# Patient Record
Sex: Male | Born: 1950
Health system: Southern US, Community
[De-identification: ages and names within clinical notes are randomized; demographics above are authoritative.]

---

## 2001-11-03 ENCOUNTER — Ambulatory Visit (HOSPITAL_BASED_OUTPATIENT_CLINIC_OR_DEPARTMENT_OTHER): Admission: RE | Admit: 2001-11-03 | Discharge: 2001-11-03 | Payer: Self-pay | Admitting: Orthopaedic Surgery

## 2001-11-20 ENCOUNTER — Emergency Department (HOSPITAL_COMMUNITY): Admission: EM | Admit: 2001-11-20 | Discharge: 2001-11-20 | Payer: Self-pay | Admitting: Emergency Medicine

## 2001-11-20 ENCOUNTER — Encounter: Payer: Self-pay | Admitting: Emergency Medicine

## 2002-06-02 ENCOUNTER — Ambulatory Visit (HOSPITAL_BASED_OUTPATIENT_CLINIC_OR_DEPARTMENT_OTHER): Admission: RE | Admit: 2002-06-02 | Discharge: 2002-06-02 | Payer: Self-pay | Admitting: Urology

## 2004-01-09 ENCOUNTER — Ambulatory Visit (HOSPITAL_COMMUNITY): Admission: RE | Admit: 2004-01-09 | Discharge: 2004-01-09 | Payer: Self-pay | Admitting: Neurosurgery

## 2017-04-09 ENCOUNTER — Other Ambulatory Visit: Payer: Self-pay | Admitting: Acute Care

## 2017-04-09 DIAGNOSIS — Z122 Encounter for screening for malignant neoplasm of respiratory organs: Secondary | ICD-10-CM

## 2017-04-09 DIAGNOSIS — F17210 Nicotine dependence, cigarettes, uncomplicated: Secondary | ICD-10-CM

## 2017-04-30 ENCOUNTER — Ambulatory Visit: Payer: Self-pay

## 2017-04-30 ENCOUNTER — Encounter: Payer: Self-pay | Admitting: Acute Care

## 2018-07-02 ENCOUNTER — Other Ambulatory Visit: Payer: Self-pay | Admitting: Acute Care

## 2018-07-02 DIAGNOSIS — Z122 Encounter for screening for malignant neoplasm of respiratory organs: Secondary | ICD-10-CM

## 2018-07-02 DIAGNOSIS — F17210 Nicotine dependence, cigarettes, uncomplicated: Secondary | ICD-10-CM

## 2018-08-03 ENCOUNTER — Ambulatory Visit: Payer: Self-pay

## 2018-08-03 ENCOUNTER — Encounter: Payer: Self-pay | Admitting: Acute Care

## 2018-11-18 ENCOUNTER — Ambulatory Visit (INDEPENDENT_AMBULATORY_CARE_PROVIDER_SITE_OTHER): Payer: Self-pay | Admitting: Acute Care

## 2018-11-18 ENCOUNTER — Ambulatory Visit (INDEPENDENT_AMBULATORY_CARE_PROVIDER_SITE_OTHER)
Admission: RE | Admit: 2018-11-18 | Discharge: 2018-11-18 | Disposition: A | Discharge status: Home / Self Care | Payer: Medicare HMO | Source: Ambulatory Visit | Attending: Acute Care | Admitting: Acute Care

## 2018-11-18 ENCOUNTER — Encounter: Payer: Self-pay | Admitting: Acute Care

## 2018-11-18 ENCOUNTER — Other Ambulatory Visit: Payer: Self-pay

## 2018-11-18 VITALS — BP 110/72 | HR 82 | Temp 98.0°F | Ht 68.0 in | Wt 193.4 lb

## 2018-11-18 DIAGNOSIS — Z122 Encounter for screening for malignant neoplasm of respiratory organs: Secondary | ICD-10-CM

## 2018-11-18 DIAGNOSIS — R69 Illness, unspecified: Secondary | ICD-10-CM | POA: Diagnosis not present

## 2018-11-18 DIAGNOSIS — F17210 Nicotine dependence, cigarettes, uncomplicated: Secondary | ICD-10-CM

## 2018-11-18 NOTE — Progress Notes (Signed)
Shared Decision Making Visit Lung Cancer Screening Program [phone])   Eligibility:  Age 68 y.o.  Pack Years Smoking History Calculation 40 pack year smoking history (# packs/per year x # years smoked)  Recent History of coughing up blood  no  Unexplained weight loss? no ( >Than 15 pounds within the last 6 months )  Prior History Lung / other cancer no (Diagnosis within the last 5 years already requiring surveillance chest CT Scans).  Smoking Status Current Smoker  Former Smokers: Years since quit: NA  Quit Date: NA  Visit Components:  Discussion included one or more decision making aids. yes  Discussion included risk/benefits of screening. yes  Discussion included potential follow up diagnostic testing for abnormal scans. yes  Discussion included meaning and risk of over diagnosis. yes  Discussion included meaning and risk of False Positives. yes  Discussion included meaning of total radiation exposure. yes  Counseling Included:  Importance of adherence to annual lung cancer LDCT screening. yes  Impact of comorbidities on ability to participate in the program. yes  Ability and willingness to under diagnostic treatment. yes  Smoking Cessation Counseling:  Current Smokers:   Discussed importance of smoking cessation. yes  Information about tobacco cessation classes and interventions provided to patient. yes  Patient provided with "ticket" for LDCT Scan. yes  Symptomatic Patient. no  CounselingNA  Diagnosis Code: Tobacco Use Z72.0  Asymptomatic Patient yes  Counseling (Intermediate counseling: > three minutes counseling) Y0737  Former Smokers:   Discussed the importance of maintaining cigarette abstinence. yes  Diagnosis Code: Personal History of Nicotine Dependence. T06.269  Information about tobacco cessation classes and interventions provided to patient. Yes  Patient provided with "ticket" for LDCT Scan. yes  Written Order for Lung Cancer  Screening with LDCT placed in Epic. Yes (CT Chest Lung Cancer Screening Low Dose W/O CM) [id number] Z12.2-Screening of respiratory organs Z87.891-Personal history of nicotine dependence  Temp 98 F (36.7 C) (Oral)   Ht 5\' 8"  (1.727 m)   Wt 193 lb 6.4 oz (87.7 kg)   BMI 29.41 kg/m  BP 110/72   Pulse 82   Temp 98 F (36.7 C) (Oral)   Ht 5\' 8"  (1.727 m)   Wt 193 lb 6.4 oz (87.7 kg)   SpO2 96%   BMI 29.41 kg/m    I have spent 25 minutes of face to face time with Ms. Ghosh discussing the risks and benefits of lung cancer screening. We viewed a power point together that explained in detail the above noted topics. We paused at intervals to allow for questions to be asked and answered to ensure understanding.We discussed that the single most powerful action that she can take to decrease her risk of developing lung cancer is to quit smoking. We discussed whether or not she is ready to commit to setting a quit date. We discussed options for tools to aid in quitting smoking including nicotine replacement therapy, non-nicotine medications, support groups, Quit Smart classes, and behavior modification. We discussed that often times setting smaller, more achievable goals, such as eliminating 1 cigarette a day for a week and then 2 cigarettes a day for a week can be helpful in slowly decreasing the number of cigarettes smoked. This allows for a sense of accomplishment as well as providing a clinical benefit. I gave her the " Be Stronger Than Your Excuses" card with contact information for community resources, classes, free nicotine replacement therapy, and access to mobile apps, text messaging, and on-line smoking cessation help.  I have also given her my card and contact information in the event she needs to contact me. We discussed the time and location of the scan, and that either Abigail Miyamotoenise Phelps RN or I will call with the results within 24-48 hours of receiving them. I have offered her  a copy of the power  point we viewed  as a resource in the event they need reinforcement of the concepts we discussed today in the office. The patient verbalized understanding of all of  the above and had no further questions upon leaving the office. They have my contact information in the event they have any further questions.  I spent 4 minutes counseling on smoking cessation and the health risks of continued tobacco abuse.  I explained to the patient that there has been a high incidence of coronary artery disease noted on these exams. I explained that this is a non-gated exam therefore degree or severity cannot be determined. This patient is on statin therapy. I have asked the patient to follow-up with their PCP regarding any incidental finding of coronary artery disease and management with diet or medication as their PCP  feels is clinically indicated. The patient verbalized understanding of the above and had no further questions upon completion of the visit.      Bevelyn NgoSarah F , NP 11/18/2018 9:41 AM

## 2018-11-23 ENCOUNTER — Other Ambulatory Visit: Payer: Self-pay | Admitting: *Deleted

## 2018-11-23 DIAGNOSIS — Z87891 Personal history of nicotine dependence: Secondary | ICD-10-CM

## 2018-11-23 DIAGNOSIS — Z122 Encounter for screening for malignant neoplasm of respiratory organs: Secondary | ICD-10-CM

## 2018-11-23 DIAGNOSIS — F17210 Nicotine dependence, cigarettes, uncomplicated: Secondary | ICD-10-CM

## 2018-12-01 DIAGNOSIS — H04123 Dry eye syndrome of bilateral lacrimal glands: Secondary | ICD-10-CM | POA: Diagnosis not present

## 2018-12-01 DIAGNOSIS — H2513 Age-related nuclear cataract, bilateral: Secondary | ICD-10-CM | POA: Diagnosis not present

## 2018-12-01 DIAGNOSIS — H5203 Hypermetropia, bilateral: Secondary | ICD-10-CM | POA: Diagnosis not present

## 2018-12-01 DIAGNOSIS — H52223 Regular astigmatism, bilateral: Secondary | ICD-10-CM | POA: Diagnosis not present

## 2018-12-01 DIAGNOSIS — H524 Presbyopia: Secondary | ICD-10-CM | POA: Diagnosis not present

## 2019-03-20 DIAGNOSIS — Z03818 Encounter for observation for suspected exposure to other biological agents ruled out: Secondary | ICD-10-CM | POA: Diagnosis not present

## 2019-06-05 ENCOUNTER — Ambulatory Visit: Payer: Medicare HMO | Attending: Internal Medicine

## 2019-06-05 DIAGNOSIS — Z23 Encounter for immunization: Secondary | ICD-10-CM | POA: Insufficient documentation

## 2019-06-05 NOTE — Progress Notes (Signed)
   Covid-19 Vaccination Clinic  Name:  Nicholas Cisneros    MRN: [id number] DOB: Sep 27, 1950  06/05/2019  Mr. Osei was observed post Covid-19 immunization for 15 minutes without incidence. He was provided with Vaccine Information Sheet and instruction to access the V-Safe system.   Mr. Reiswig was instructed to call 911 with any severe reactions post vaccine: Marland Kitchen Difficulty breathing  . Swelling of your face and throat  . A fast heartbeat  . A bad rash all over your body  . Dizziness and weakness    Immunizations Administered    Name Date Dose VIS Date Route   Pfizer COVID-19 Vaccine 06/05/2019  3:17 PM 0.3 mL 04/23/2019 Intramuscular   Manufacturer: [employer], [employer]   Lot: [id number]   NDC: [address]-2

## 2019-06-12 DIAGNOSIS — Z20822 Contact with and (suspected) exposure to covid-19: Secondary | ICD-10-CM | POA: Diagnosis not present

## 2019-06-21 ENCOUNTER — Other Ambulatory Visit: Payer: Self-pay

## 2019-06-21 ENCOUNTER — Emergency Department (HOSPITAL_COMMUNITY): Payer: Medicare HMO

## 2019-06-21 ENCOUNTER — Encounter (HOSPITAL_COMMUNITY): Payer: Self-pay | Admitting: Emergency Medicine

## 2019-06-21 ENCOUNTER — Emergency Department (HOSPITAL_COMMUNITY)
Admission: EM | Admit: 2019-06-21 | Discharge: 2019-06-21 | Disposition: A | Discharge status: Home / Self Care | Payer: Medicare HMO | Attending: Emergency Medicine | Admitting: Emergency Medicine

## 2019-06-21 DIAGNOSIS — R079 Chest pain, unspecified: Secondary | ICD-10-CM | POA: Diagnosis not present

## 2019-06-21 DIAGNOSIS — F17210 Nicotine dependence, cigarettes, uncomplicated: Secondary | ICD-10-CM | POA: Insufficient documentation

## 2019-06-21 DIAGNOSIS — R0789 Other chest pain: Secondary | ICD-10-CM | POA: Diagnosis not present

## 2019-06-21 DIAGNOSIS — R69 Illness, unspecified: Secondary | ICD-10-CM | POA: Diagnosis not present

## 2019-06-21 LAB — BASIC METABOLIC PANEL
Anion gap: 11 (ref 5–15)
BUN: 11 mg/dL (ref 8–23)
CO2: 23 mmol/L (ref 22–32)
Calcium: 9.1 mg/dL (ref 8.9–10.3)
Chloride: 104 mmol/L (ref 98–111)
Creatinine, Ser: 1.07 mg/dL (ref 0.61–1.24)
GFR calc Af Amer: >60 mL/min (ref >60)
GFR calc non Af Amer: >60 mL/min (ref >60)
Glucose, Bld: 99 mg/dL (ref 70–99)
Potassium: 3.8 mmol/L (ref 3.5–5.1)
Sodium: 138 mmol/L (ref 135–145)

## 2019-06-21 LAB — TROPONIN I (HIGH SENSITIVITY)
Troponin I (High Sensitivity): <2 ng/L (ref <18)
Troponin I (High Sensitivity): 3 ng/L (ref <18)

## 2019-06-21 LAB — CBC
HCT: 45.7 % (ref 39.0–52.0)
Hemoglobin: 15.2 g/dL (ref 13.0–17.0)
MCH: 30.9 pg (ref 26.0–34.0)
MCHC: 33.3 g/dL (ref 30.0–36.0)
MCV: 92.9 fL (ref 80.0–100.0)
Platelets: 173 10*3/uL (ref 150–400)
RBC: 4.92 MIL/uL (ref 4.22–5.81)
RDW: 12 % (ref 11.5–15.5)
WBC: 8.6 10*3/uL (ref 4.0–10.5)
nRBC: 0 % (ref 0.0–0.2)

## 2019-06-21 MED ORDER — SODIUM CHLORIDE 0.9% FLUSH
3.0000 mL | Freq: Once | INTRAVENOUS | Status: AC
  Administered 2019-06-21: 3 mL via INTRAVENOUS

## 2019-06-21 NOTE — ED Provider Notes (Signed)
[facility]GENCY DEPARTMENT Provider Note   CSN: [id number] Arrival date & time: 06/21/19  1533     History Chief Complaint  Patient presents with  . Chest Pain    Nicholas Cisneros is a 69 y.o. male.  Patient is a 69 year old gentleman with no past medical history presenting to the emergency department for chest pain.  Patient reports that this has been going off and on for about a month.  Patient reports that the pain is worse with movement and better with rest.  Reports that he has been lifting and twisting and doing more strenuous activity lately.  She reports that he can pinpoint the pain in the center of his chest sometimes and when he coughs he can feel the pain.  It is not worse with deep breaths. denies any pain right now.  Denies any shortness of breath.  Denies any leg swelling.        History reviewed. No pertinent past medical history.  There are no problems to display for this patient.   History reviewed. No pertinent surgical history.     No family history on file.  Social History   Tobacco Use  . Smoking status: Current Every Day Smoker    Packs/day: 0.25    Years: 54.00    Pack years: 13.50    Types: Cigarettes    Start date: 05/14/1963    Last attempt to quit: 11/15/2018    Years since quitting: 0.5  . Smokeless tobacco: Never Used  Substance Use Topics  . Alcohol use: Yes    Comment: 7-8 beers/week  . Drug use: Not Currently    Home Medications Prior to Admission medications   Medication Sig Start Date End Date Taking? Authorizing Provider  Multiple Vitamin (MULTIVITAMIN WITH MINERALS) TABS tablet Take 1 tablet by mouth daily. Men's One a Day   Yes [provider]  nicotine (NICODERM CQ - DOSED IN MG/24 HOURS) 14 mg/24hr patch Place 14 mg onto the skin daily as needed (smoking cessation).   Yes [provider]    Allergies    Bee venom and Penicillins  Review of Systems   Review of Systems   Constitutional: Negative for chills and fever.  HENT: Negative for congestion and sore throat.   Respiratory: Negative for cough, chest tightness, shortness of breath and wheezing.   Cardiovascular: Positive for chest pain. Negative for palpitations and leg swelling.  Gastrointestinal: Negative for abdominal pain, diarrhea, nausea and vomiting.  Genitourinary: Negative for dysuria.  Musculoskeletal: Negative for back pain.  Skin: Negative for rash.  Neurological: Negative for dizziness, weakness, light-headedness and headaches.    Physical Exam Updated Vital Signs BP 133/73   Pulse 61   Temp 98.3 F (36.8 C) (Oral)   Resp 12   Ht 5\' 8"  (1.727 m)   Wt 81.6 kg   SpO2 98%   BMI 27.37 kg/m   Physical Exam Vitals and nursing note reviewed.  Constitutional:      General: He is not in acute distress.    Appearance: Normal appearance. He is well-developed. He is not ill-appearing, toxic-appearing or diaphoretic.  HENT:     Head: Normocephalic.  Eyes:     Conjunctiva/sclera: Conjunctivae normal.  Pulmonary:     Effort: Pulmonary effort is normal. No tachypnea, accessory muscle usage or respiratory distress.     Breath sounds: Normal breath sounds. No stridor. No decreased breath sounds, wheezing, rhonchi or rales.  Chest:     Comments:  Patient's pain is reproduced when he was moving his upper torso. Musculoskeletal:     Right lower leg: No edema.     Left lower leg: No edema.  Skin:    General: Skin is warm and dry.  Neurological:     Mental Status: He is alert.  Psychiatric:        Mood and Affect: Mood normal.     ED Results / Procedures / Treatments   Labs (all labs ordered are listed, but only abnormal results are displayed) Labs Reviewed  BASIC METABOLIC PANEL  CBC  TROPONIN I (HIGH SENSITIVITY)  TROPONIN I (HIGH SENSITIVITY)    EKG EKG Interpretation  Date/Time:  Monday June 21 2019 15:39:46 EST Ventricular Rate:  74 PR Interval:  166 QRS Duration:  90 QT Interval:  382 QTC Calculation: 424 R Axis:   73 Text Interpretation: Normal sinus rhythm Normal ECG No STEMI Confirmed by Alvester Chou [phone]) on 06/21/2019 7:09:56 PM   Radiology DG Chest 2 View  Result Date: 06/21/2019 CLINICAL DATA:  Chest pain. EXAM: CHEST - 2 VIEW COMPARISON:  CT chest 11/18/2018. FINDINGS: Trachea is midline. Heart size normal. Right middle lobe calcified granuloma. Lungs are hyperinflated but otherwise clear. No pleural fluid. IMPRESSION: Hyperinflation.  No acute findings. Electronically Signed   By: Leanna Battles M.D.   On: 06/21/2019 16:34    Procedures Procedures (including critical care time)  Medications Ordered in ED Medications  sodium chloride flush (NS) 0.9 % injection 3 mL (3 mLs Intravenous Given 06/21/19 1931)    ED Course  I have reviewed the triage vital signs and the nursing notes.  Pertinent labs & imaging results that were available during my care of the patient were reviewed by me and considered in my medical decision making (see chart for details).  Clinical Course as of Jun 20 2128  Mon Jun 21, 2019  1958 Patient with heart score of 3 presenting with intermittent atypical type chest pain over the past 1 month.  Patient's pain was reproduced with movement on exam.  It seems to be costochondritis in the setting of increased strenuous activity.  His work-up was reassuring.  Including negative troponin x2.  Patient was also seen by Dr. Brion Aliment and plan agreed upon for discharge home and follow-up with primary care doctor.   [KM]    Clinical Course User Index [KM] Jeral Pinch   MDM Rules/Calculators/A&P                      Based on review of vitals, medical screening exam, lab work and/or imaging, there does not appear to be an acute, emergent etiology for the patient's symptoms. Counseled pt on good return precautions and encouraged both PCP and ED follow-up as needed.  Prior to discharge, I also discussed incidental  imaging findings with patient in detail and advised appropriate, recommended follow-up in detail.  Clinical Impression: 1. Chest pain, unspecified type     Disposition: Discharge  Prior to providing a prescription for a controlled substance, I independently reviewed the patient's recent prescription history on the [place] Controlled Substance Reporting System. The patient had no recent or regular prescriptions and was deemed appropriate for a brief, less than 3 day prescription of narcotic for acute analgesia.  This note was prepared with assistance of Conservation officer, historic buildings. Occasional wrong-word or sound-a-like substitutions may have occurred due to the inherent limitations of voice recognition software.  Final Clinical Impression(s) / ED Diagnoses  Final diagnoses:  Chest pain, unspecified type    Rx / DC Orders ED Discharge Orders    None       Jeral Pinch 06/21/19 2130    Terald Sleeper, MD 06/22/19 1327

## 2019-06-21 NOTE — Discharge Instructions (Signed)
Thank you for allowing me to care for you today. Please return to the emergency department if you have new or worsening symptoms. Take your medications as instructed.  ° °

## 2019-06-21 NOTE — ED Triage Notes (Signed)
Pt in POV. Reports intermittent CP X1 mo with most recent episode being this AM. Initially R sided, now center of chest. Sharp in nature. No pain at the present time. States he is able to reproduce pain if he coughs or sneezes.

## 2019-06-28 ENCOUNTER — Ambulatory Visit: Payer: Medicare HMO | Attending: Internal Medicine

## 2019-06-28 DIAGNOSIS — Z23 Encounter for immunization: Secondary | ICD-10-CM | POA: Insufficient documentation

## 2019-06-28 NOTE — Progress Notes (Signed)
   Covid-19 Vaccination Clinic  Name:  GRIFFIN GERRARD    MRN: [id number] DOB: 04-Oct-1950  06/28/2019  Mr. Downum was observed post Covid-19 immunization for 15 minutes without incidence. He was provided with Vaccine Information Sheet and instruction to access the V-Safe system.   Mr. Frisch was instructed to call 911 with any severe reactions post vaccine: Marland Kitchen Difficulty breathing  . Swelling of your face and throat  . A fast heartbeat  . A bad rash all over your body  . Dizziness and weakness    Immunizations Administered    Name Date Dose VIS Date Route   Pfizer COVID-19 Vaccine 06/28/2019  8:35 AM 0.3 mL 04/23/2019 Intramuscular   Manufacturer: [employer], [employer]   Lot: [id number]   NDC: [address]-1

## 2019-09-13 DIAGNOSIS — Z880 Allergy status to penicillin: Secondary | ICD-10-CM | POA: Diagnosis not present

## 2019-09-13 DIAGNOSIS — Z833 Family history of diabetes mellitus: Secondary | ICD-10-CM | POA: Diagnosis not present

## 2019-09-13 DIAGNOSIS — Z791 Long term (current) use of non-steroidal anti-inflammatories (NSAID): Secondary | ICD-10-CM | POA: Diagnosis not present

## 2019-09-13 DIAGNOSIS — R69 Illness, unspecified: Secondary | ICD-10-CM | POA: Diagnosis not present

## 2019-09-13 DIAGNOSIS — Z720 Tobacco use: Secondary | ICD-10-CM | POA: Diagnosis not present

## 2019-09-13 DIAGNOSIS — R030 Elevated blood-pressure reading, without diagnosis of hypertension: Secondary | ICD-10-CM | POA: Diagnosis not present

## 2019-09-13 DIAGNOSIS — Z8249 Family history of ischemic heart disease and other diseases of the circulatory system: Secondary | ICD-10-CM | POA: Diagnosis not present

## 2019-09-13 DIAGNOSIS — Z841 Family history of disorders of kidney and ureter: Secondary | ICD-10-CM | POA: Diagnosis not present

## 2019-09-18 DIAGNOSIS — K047 Periapical abscess without sinus: Secondary | ICD-10-CM | POA: Diagnosis not present

## 2019-09-23 DIAGNOSIS — M25511 Pain in right shoulder: Secondary | ICD-10-CM | POA: Diagnosis not present

## 2019-09-23 DIAGNOSIS — S46919A Strain of unspecified muscle, fascia and tendon at shoulder and upper arm level, unspecified arm, initial encounter: Secondary | ICD-10-CM | POA: Diagnosis not present

## 2019-09-30 DIAGNOSIS — M25811 Other specified joint disorders, right shoulder: Secondary | ICD-10-CM | POA: Diagnosis not present

## 2019-09-30 DIAGNOSIS — M25812 Other specified joint disorders, left shoulder: Secondary | ICD-10-CM | POA: Diagnosis not present

## 2019-09-30 DIAGNOSIS — M6281 Muscle weakness (generalized): Secondary | ICD-10-CM | POA: Diagnosis not present

## 2019-09-30 DIAGNOSIS — M25512 Pain in left shoulder: Secondary | ICD-10-CM | POA: Diagnosis not present

## 2019-09-30 DIAGNOSIS — M25511 Pain in right shoulder: Secondary | ICD-10-CM | POA: Diagnosis not present

## 2019-10-04 DIAGNOSIS — M25511 Pain in right shoulder: Secondary | ICD-10-CM | POA: Diagnosis not present

## 2019-10-04 DIAGNOSIS — M25811 Other specified joint disorders, right shoulder: Secondary | ICD-10-CM | POA: Diagnosis not present

## 2019-10-04 DIAGNOSIS — M25812 Other specified joint disorders, left shoulder: Secondary | ICD-10-CM | POA: Diagnosis not present

## 2019-10-04 DIAGNOSIS — M6281 Muscle weakness (generalized): Secondary | ICD-10-CM | POA: Diagnosis not present

## 2019-10-04 DIAGNOSIS — M25512 Pain in left shoulder: Secondary | ICD-10-CM | POA: Diagnosis not present

## 2019-10-06 DIAGNOSIS — M6281 Muscle weakness (generalized): Secondary | ICD-10-CM | POA: Diagnosis not present

## 2019-10-06 DIAGNOSIS — M25811 Other specified joint disorders, right shoulder: Secondary | ICD-10-CM | POA: Diagnosis not present

## 2019-10-06 DIAGNOSIS — M25812 Other specified joint disorders, left shoulder: Secondary | ICD-10-CM | POA: Diagnosis not present

## 2019-10-06 DIAGNOSIS — M25512 Pain in left shoulder: Secondary | ICD-10-CM | POA: Diagnosis not present

## 2019-10-06 DIAGNOSIS — M25511 Pain in right shoulder: Secondary | ICD-10-CM | POA: Diagnosis not present

## 2019-10-12 DIAGNOSIS — M25812 Other specified joint disorders, left shoulder: Secondary | ICD-10-CM | POA: Diagnosis not present

## 2019-10-12 DIAGNOSIS — M25512 Pain in left shoulder: Secondary | ICD-10-CM | POA: Diagnosis not present

## 2019-10-12 DIAGNOSIS — M6281 Muscle weakness (generalized): Secondary | ICD-10-CM | POA: Diagnosis not present

## 2019-10-12 DIAGNOSIS — M25811 Other specified joint disorders, right shoulder: Secondary | ICD-10-CM | POA: Diagnosis not present

## 2019-10-12 DIAGNOSIS — M25511 Pain in right shoulder: Secondary | ICD-10-CM | POA: Diagnosis not present

## 2019-10-18 DIAGNOSIS — Z23 Encounter for immunization: Secondary | ICD-10-CM | POA: Diagnosis not present

## 2019-10-26 DIAGNOSIS — M25512 Pain in left shoulder: Secondary | ICD-10-CM | POA: Diagnosis not present

## 2019-10-26 DIAGNOSIS — M6281 Muscle weakness (generalized): Secondary | ICD-10-CM | POA: Diagnosis not present

## 2019-10-26 DIAGNOSIS — M25511 Pain in right shoulder: Secondary | ICD-10-CM | POA: Diagnosis not present

## 2019-10-26 DIAGNOSIS — M25812 Other specified joint disorders, left shoulder: Secondary | ICD-10-CM | POA: Diagnosis not present

## 2019-10-26 DIAGNOSIS — M25811 Other specified joint disorders, right shoulder: Secondary | ICD-10-CM | POA: Diagnosis not present

## 2019-11-02 DIAGNOSIS — M25512 Pain in left shoulder: Secondary | ICD-10-CM | POA: Diagnosis not present

## 2019-11-02 DIAGNOSIS — M25812 Other specified joint disorders, left shoulder: Secondary | ICD-10-CM | POA: Diagnosis not present

## 2019-11-02 DIAGNOSIS — M25511 Pain in right shoulder: Secondary | ICD-10-CM | POA: Diagnosis not present

## 2019-11-02 DIAGNOSIS — M25811 Other specified joint disorders, right shoulder: Secondary | ICD-10-CM | POA: Diagnosis not present

## 2019-11-02 DIAGNOSIS — M6281 Muscle weakness (generalized): Secondary | ICD-10-CM | POA: Diagnosis not present

## 2019-11-08 DIAGNOSIS — M7541 Impingement syndrome of right shoulder: Secondary | ICD-10-CM | POA: Diagnosis not present

## 2019-11-11 DIAGNOSIS — M25512 Pain in left shoulder: Secondary | ICD-10-CM | POA: Diagnosis not present

## 2019-11-11 DIAGNOSIS — M25511 Pain in right shoulder: Secondary | ICD-10-CM | POA: Diagnosis not present

## 2019-11-11 DIAGNOSIS — M25811 Other specified joint disorders, right shoulder: Secondary | ICD-10-CM | POA: Diagnosis not present

## 2019-11-11 DIAGNOSIS — M25812 Other specified joint disorders, left shoulder: Secondary | ICD-10-CM | POA: Diagnosis not present

## 2019-11-11 DIAGNOSIS — M6281 Muscle weakness (generalized): Secondary | ICD-10-CM | POA: Diagnosis not present

## 2019-11-24 IMAGING — CT CT CHEST LUNG CANCER SCREENING LOW DOSE
2 of 3 series · 15 of 36 positions shown, 18 images · non-contrast
Comparison: None.

CLINICAL DATA: Current smoker, 40 pack-year history.

EXAM:
CT CHEST WITHOUT CONTRAST LOW-DOSE FOR LUNG CANCER SCREENING
TECHNIQUE: Multidetector CT imaging of the chest was performed following the
standard protocol without IV contrast.

[Series 2: thorax 5.0 i31f 3 · axial · 0.79mm/px · z∈[-317,-42]mm · 12 of 65 slices shown, 15 images]
[im 5/65  mediastinal]
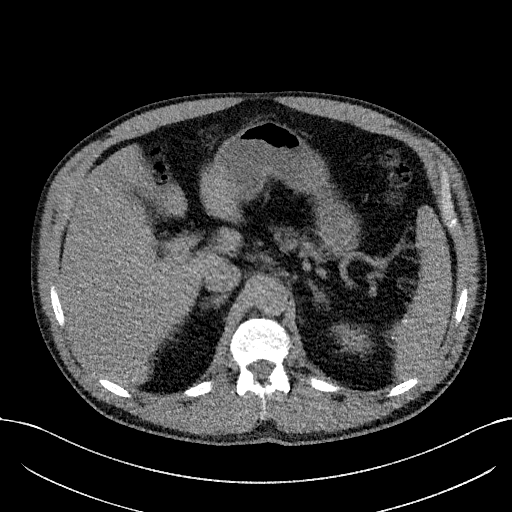
[im 5/65  lung]
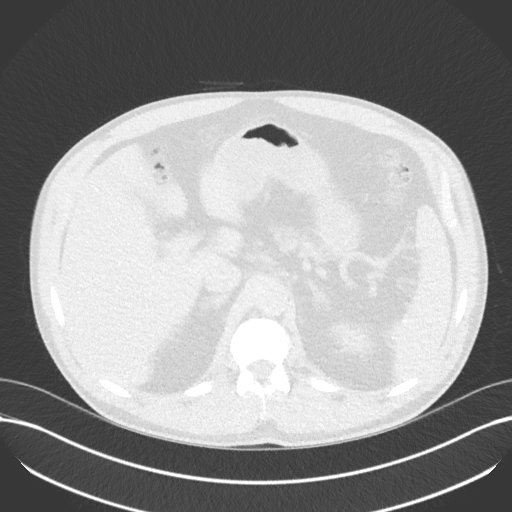
[im 10/65  lung]
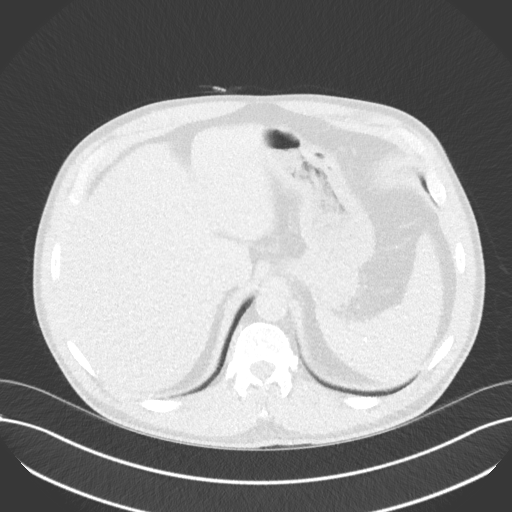
[im 15/65  lung]
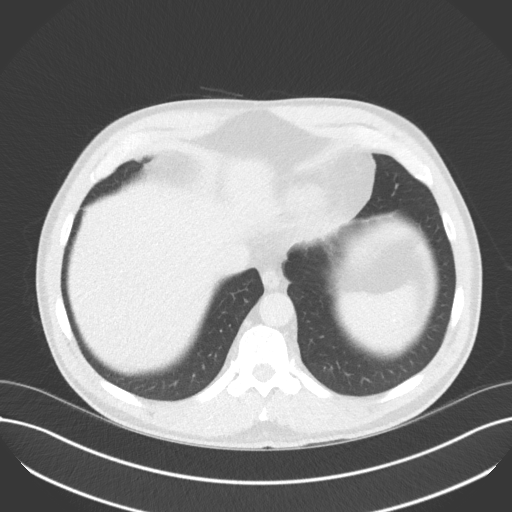
[im 19/65  lung]
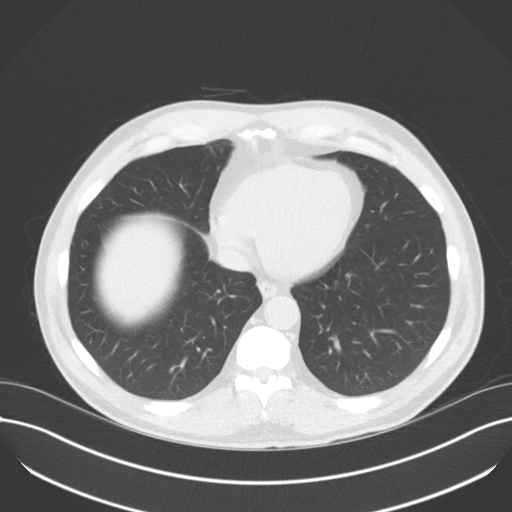
[im 24/65  mediastinal]
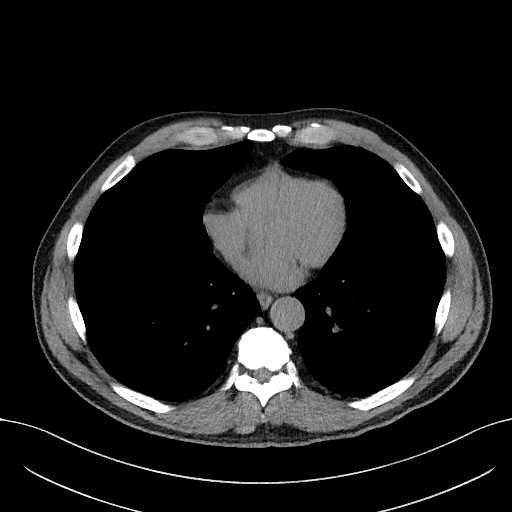
[im 24/65  lung]
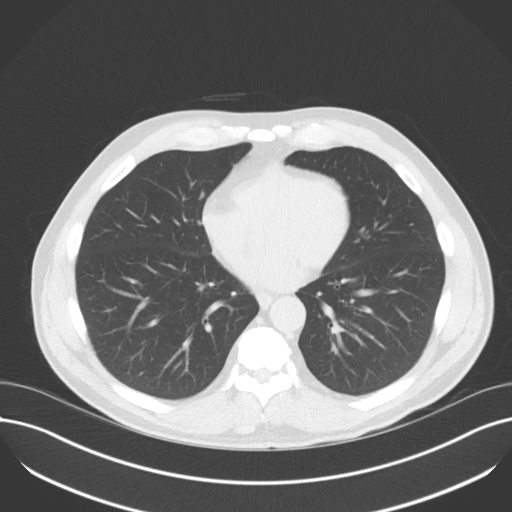
[im 29/65  lung]
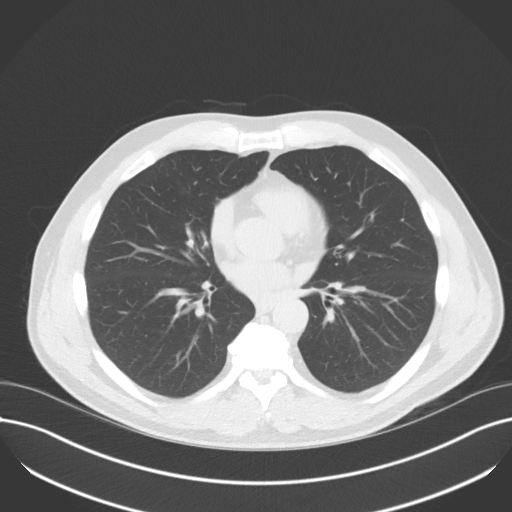
[im 36/65  lung]
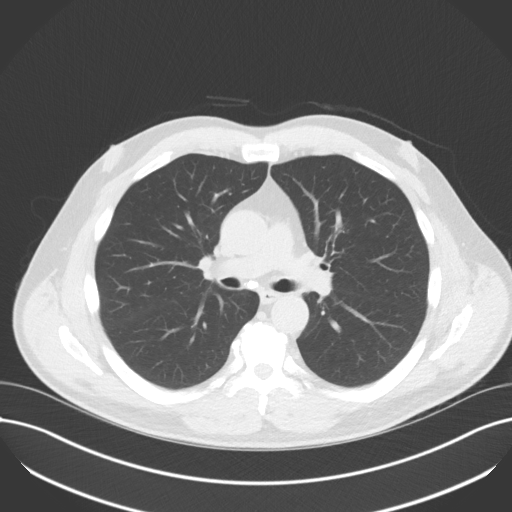
[im 41/65  lung]
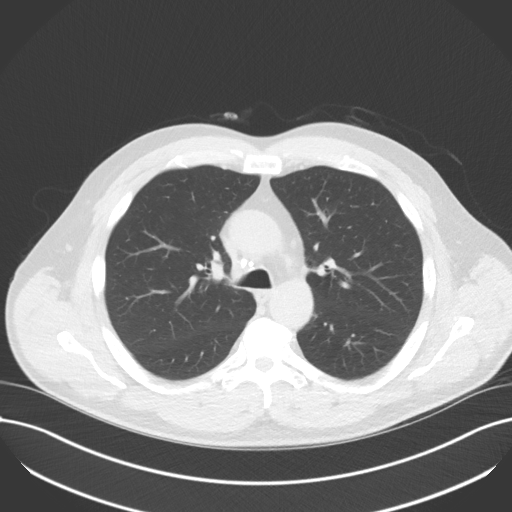
[im 46/65  mediastinal]
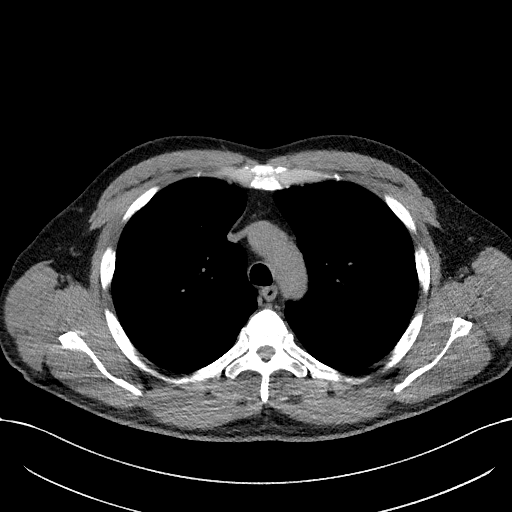
[im 46/65  lung]
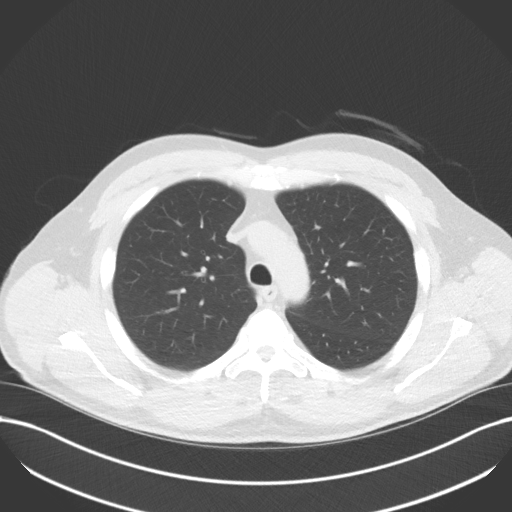
[im 50/65  lung]
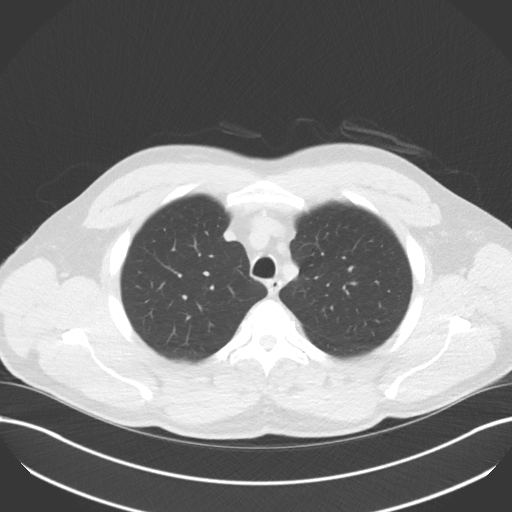
[im 55/65  lung]
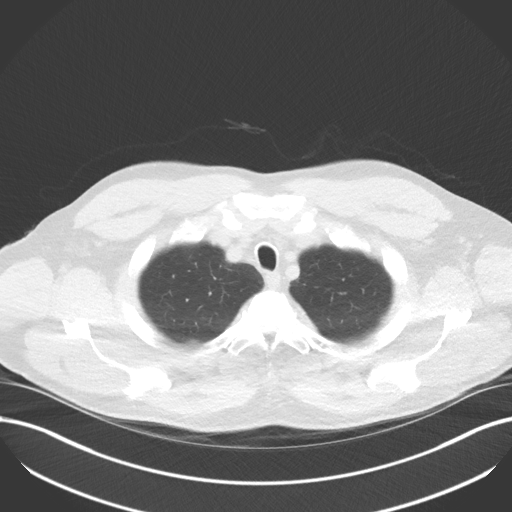
[im 60/65  lung]
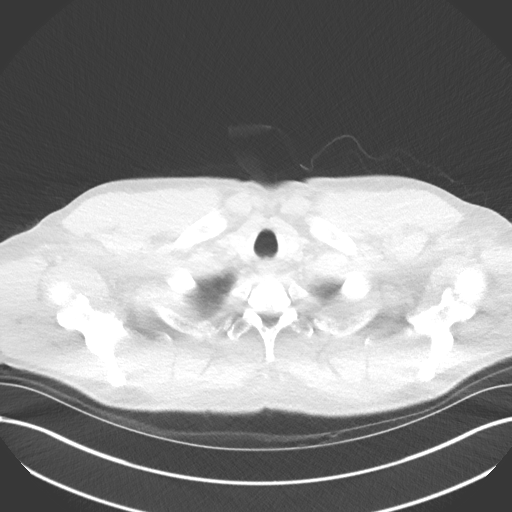

[Series 5: coronal · coronal · 0.64mm/px · 3 of 129 slices shown]
[im 26/129  lung]
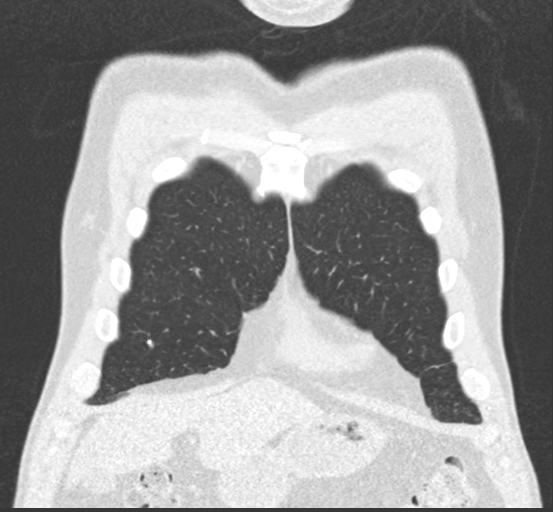
[im 52/129  lung]
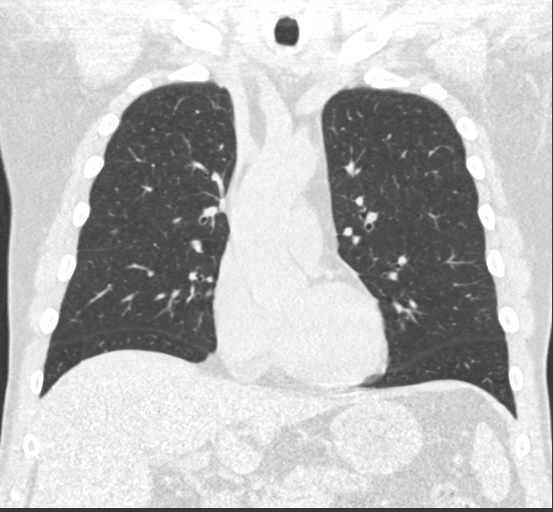
[im 77/129  lung]
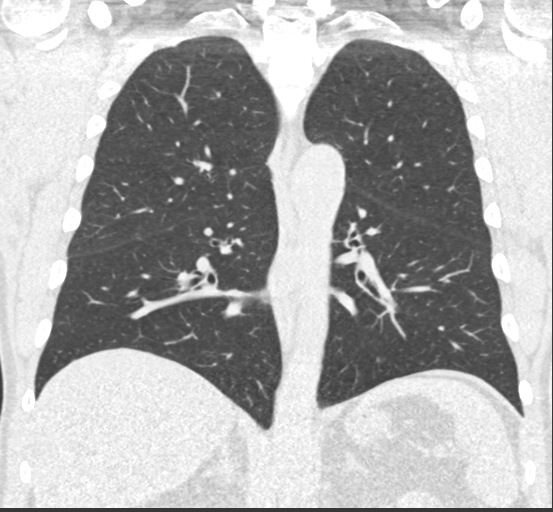

[15 of 36 positions shown; findings below may reference images not displayed]

FINDINGS: Cardiovascular: Atherosclerotic calcification of the aorta and
coronary arteries. Heart size normal. No pericardial effusion.

Mediastinum/Nodes: Calcified mediastinal and hilar lymph nodes. No
pathologically enlarged mediastinal or axillary lymph nodes. Hilar
regions are otherwise difficult to definitively evaluate without IV
contrast. Esophagus is grossly unremarkable.

Lungs/Pleura: Centrilobular emphysema. Smoking related respiratory
bronchiolitis. Calcified granuloma in the right middle lobe. No
worrisome pulmonary nodules. No pleural fluid. Airway is
unremarkable.

Upper Abdomen: There may be a subcentimeter low-attenuation lesion
in segment 4 of the liver, too small to characterize. Visualized
portions of the liver, gallbladder, adrenal glands, kidneys, spleen,
pancreas, stomach and bowel are otherwise unremarkable. No upper
abdominal adenopathy.

Musculoskeletal: Degenerative changes in the spine. No worrisome
lytic or sclerotic lesions.
IMPRESSION: 1. Lung-RADS 1, negative. Continue annual screening with low-dose
chest CT without contrast in 12 months.
2. Aortic atherosclerosis (UG2IF-170.0). Coronary artery
calcification.
3.  Emphysema ([id number].J).

## 2019-12-01 DIAGNOSIS — M25511 Pain in right shoulder: Secondary | ICD-10-CM | POA: Diagnosis not present

## 2019-12-08 DIAGNOSIS — M25511 Pain in right shoulder: Secondary | ICD-10-CM | POA: Diagnosis not present

## 2020-01-31 DIAGNOSIS — M25511 Pain in right shoulder: Secondary | ICD-10-CM | POA: Diagnosis not present

## 2020-03-01 DIAGNOSIS — M25511 Pain in right shoulder: Secondary | ICD-10-CM | POA: Diagnosis not present

## 2020-03-02 DIAGNOSIS — M25511 Pain in right shoulder: Secondary | ICD-10-CM | POA: Diagnosis not present

## 2020-03-02 DIAGNOSIS — M19012 Primary osteoarthritis, left shoulder: Secondary | ICD-10-CM | POA: Diagnosis not present

## 2020-04-10 DIAGNOSIS — M25511 Pain in right shoulder: Secondary | ICD-10-CM | POA: Diagnosis not present

## 2020-04-17 DIAGNOSIS — Z23 Encounter for immunization: Secondary | ICD-10-CM | POA: Diagnosis not present

## 2020-04-17 DIAGNOSIS — Z1389 Encounter for screening for other disorder: Secondary | ICD-10-CM | POA: Diagnosis not present

## 2020-04-17 DIAGNOSIS — Z0000 Encounter for general adult medical examination without abnormal findings: Secondary | ICD-10-CM | POA: Diagnosis not present

## 2020-04-28 DIAGNOSIS — Z7289 Other problems related to lifestyle: Secondary | ICD-10-CM | POA: Diagnosis not present

## 2020-04-28 DIAGNOSIS — R0683 Snoring: Secondary | ICD-10-CM | POA: Diagnosis not present

## 2020-04-28 DIAGNOSIS — M25512 Pain in left shoulder: Secondary | ICD-10-CM | POA: Diagnosis not present

## 2020-04-28 DIAGNOSIS — Z125 Encounter for screening for malignant neoplasm of prostate: Secondary | ICD-10-CM | POA: Diagnosis not present

## 2020-04-28 DIAGNOSIS — R69 Illness, unspecified: Secondary | ICD-10-CM | POA: Diagnosis not present

## 2020-04-28 DIAGNOSIS — M19012 Primary osteoarthritis, left shoulder: Secondary | ICD-10-CM | POA: Diagnosis not present

## 2020-04-28 DIAGNOSIS — R768 Other specified abnormal immunological findings in serum: Secondary | ICD-10-CM | POA: Diagnosis not present

## 2020-04-28 DIAGNOSIS — R400 Somnolence: Secondary | ICD-10-CM | POA: Diagnosis not present

## 2020-04-28 DIAGNOSIS — R7303 Prediabetes: Secondary | ICD-10-CM | POA: Diagnosis not present

## 2020-04-28 DIAGNOSIS — G8929 Other chronic pain: Secondary | ICD-10-CM | POA: Diagnosis not present

## 2020-05-24 DIAGNOSIS — M25511 Pain in right shoulder: Secondary | ICD-10-CM | POA: Diagnosis not present

## 2020-06-12 DIAGNOSIS — G478 Other sleep disorders: Secondary | ICD-10-CM | POA: Diagnosis not present

## 2020-06-12 DIAGNOSIS — G4719 Other hypersomnia: Secondary | ICD-10-CM | POA: Diagnosis not present

## 2020-06-12 DIAGNOSIS — R0683 Snoring: Secondary | ICD-10-CM | POA: Diagnosis not present

## 2020-06-26 IMAGING — CR DG CHEST 2V
2 series · 2 of 2 positions shown · non-contrast
Comparison: CT chest 11/18/2018.

CLINICAL DATA: Chest pain.

EXAM:
CHEST - 2 VIEW

[chest pa]
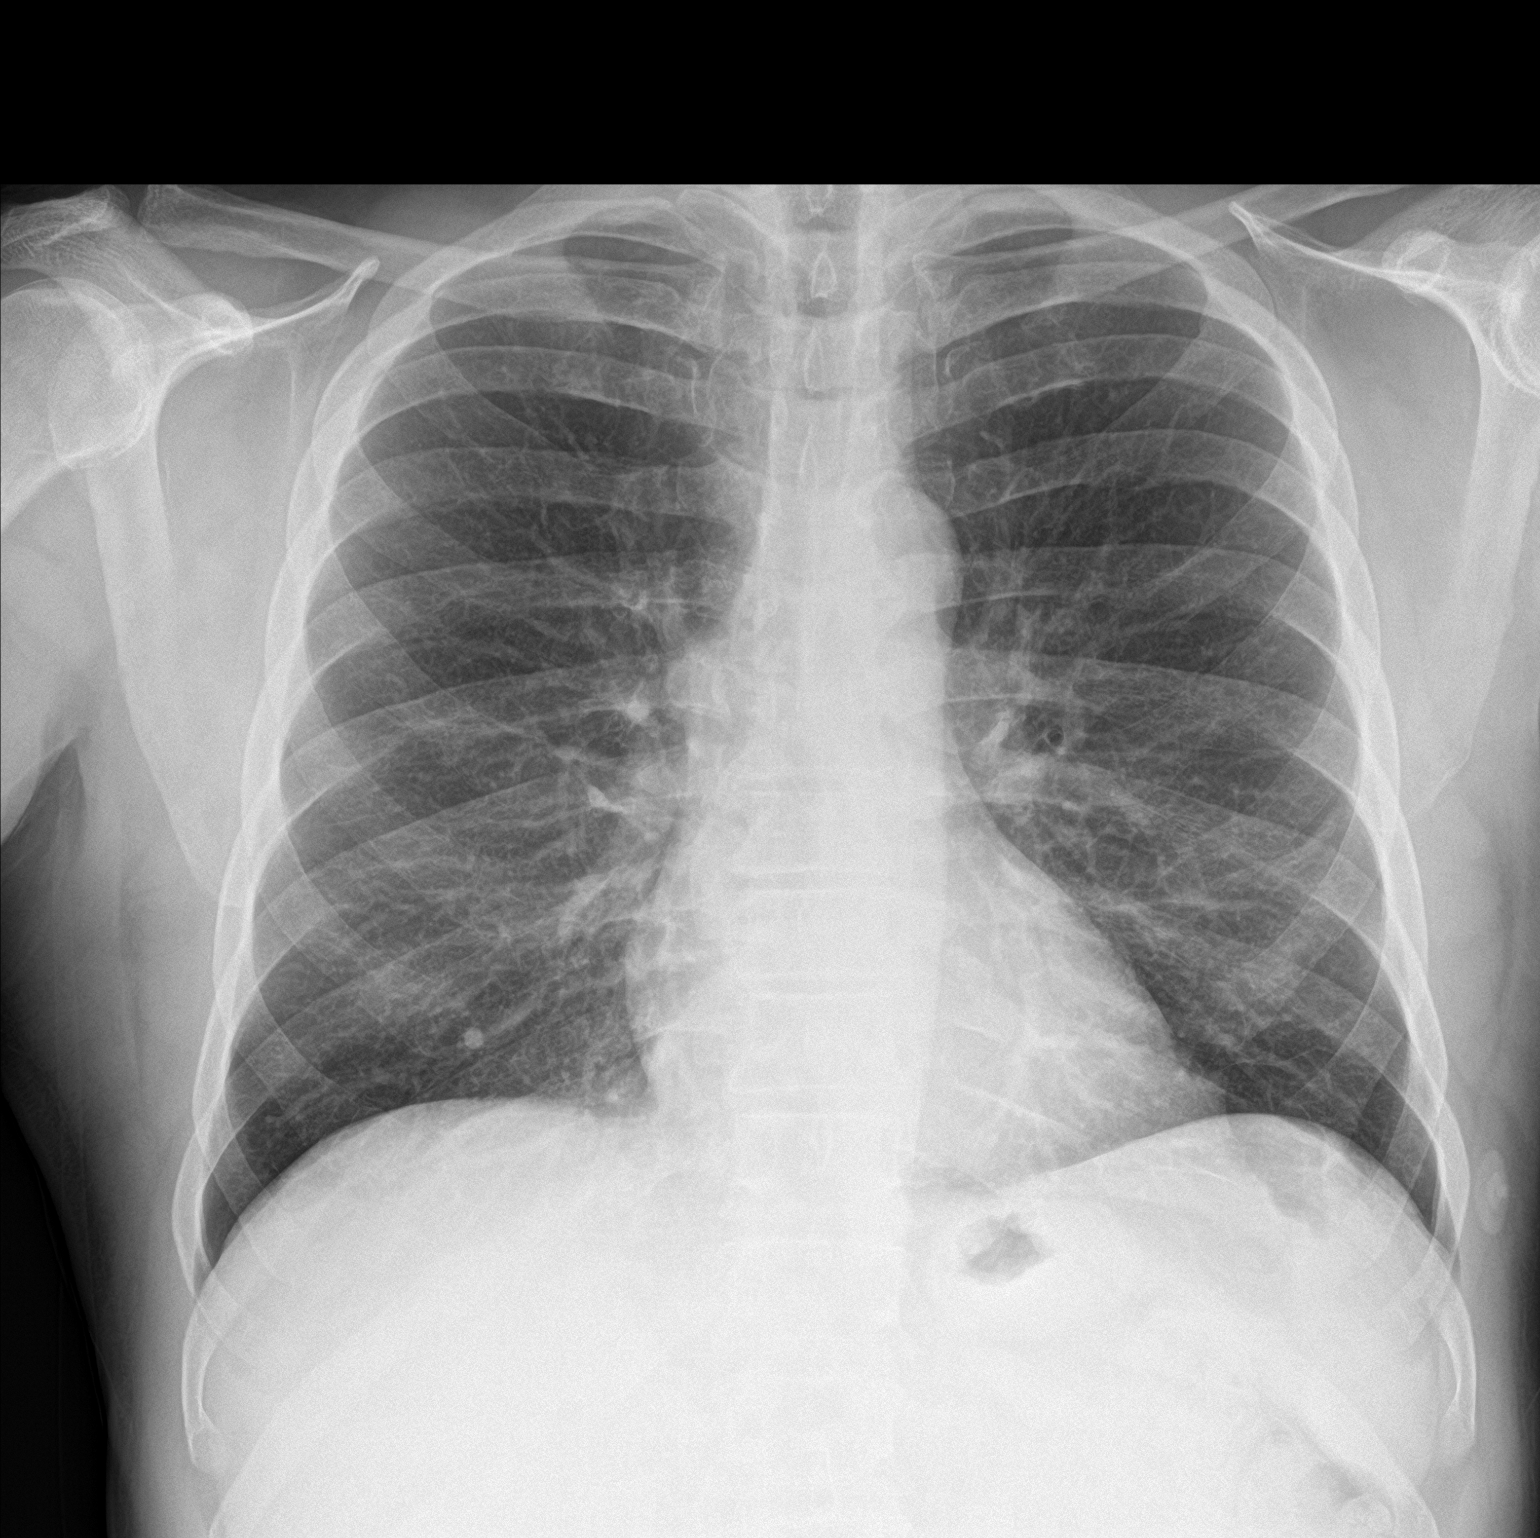

[chest lat]
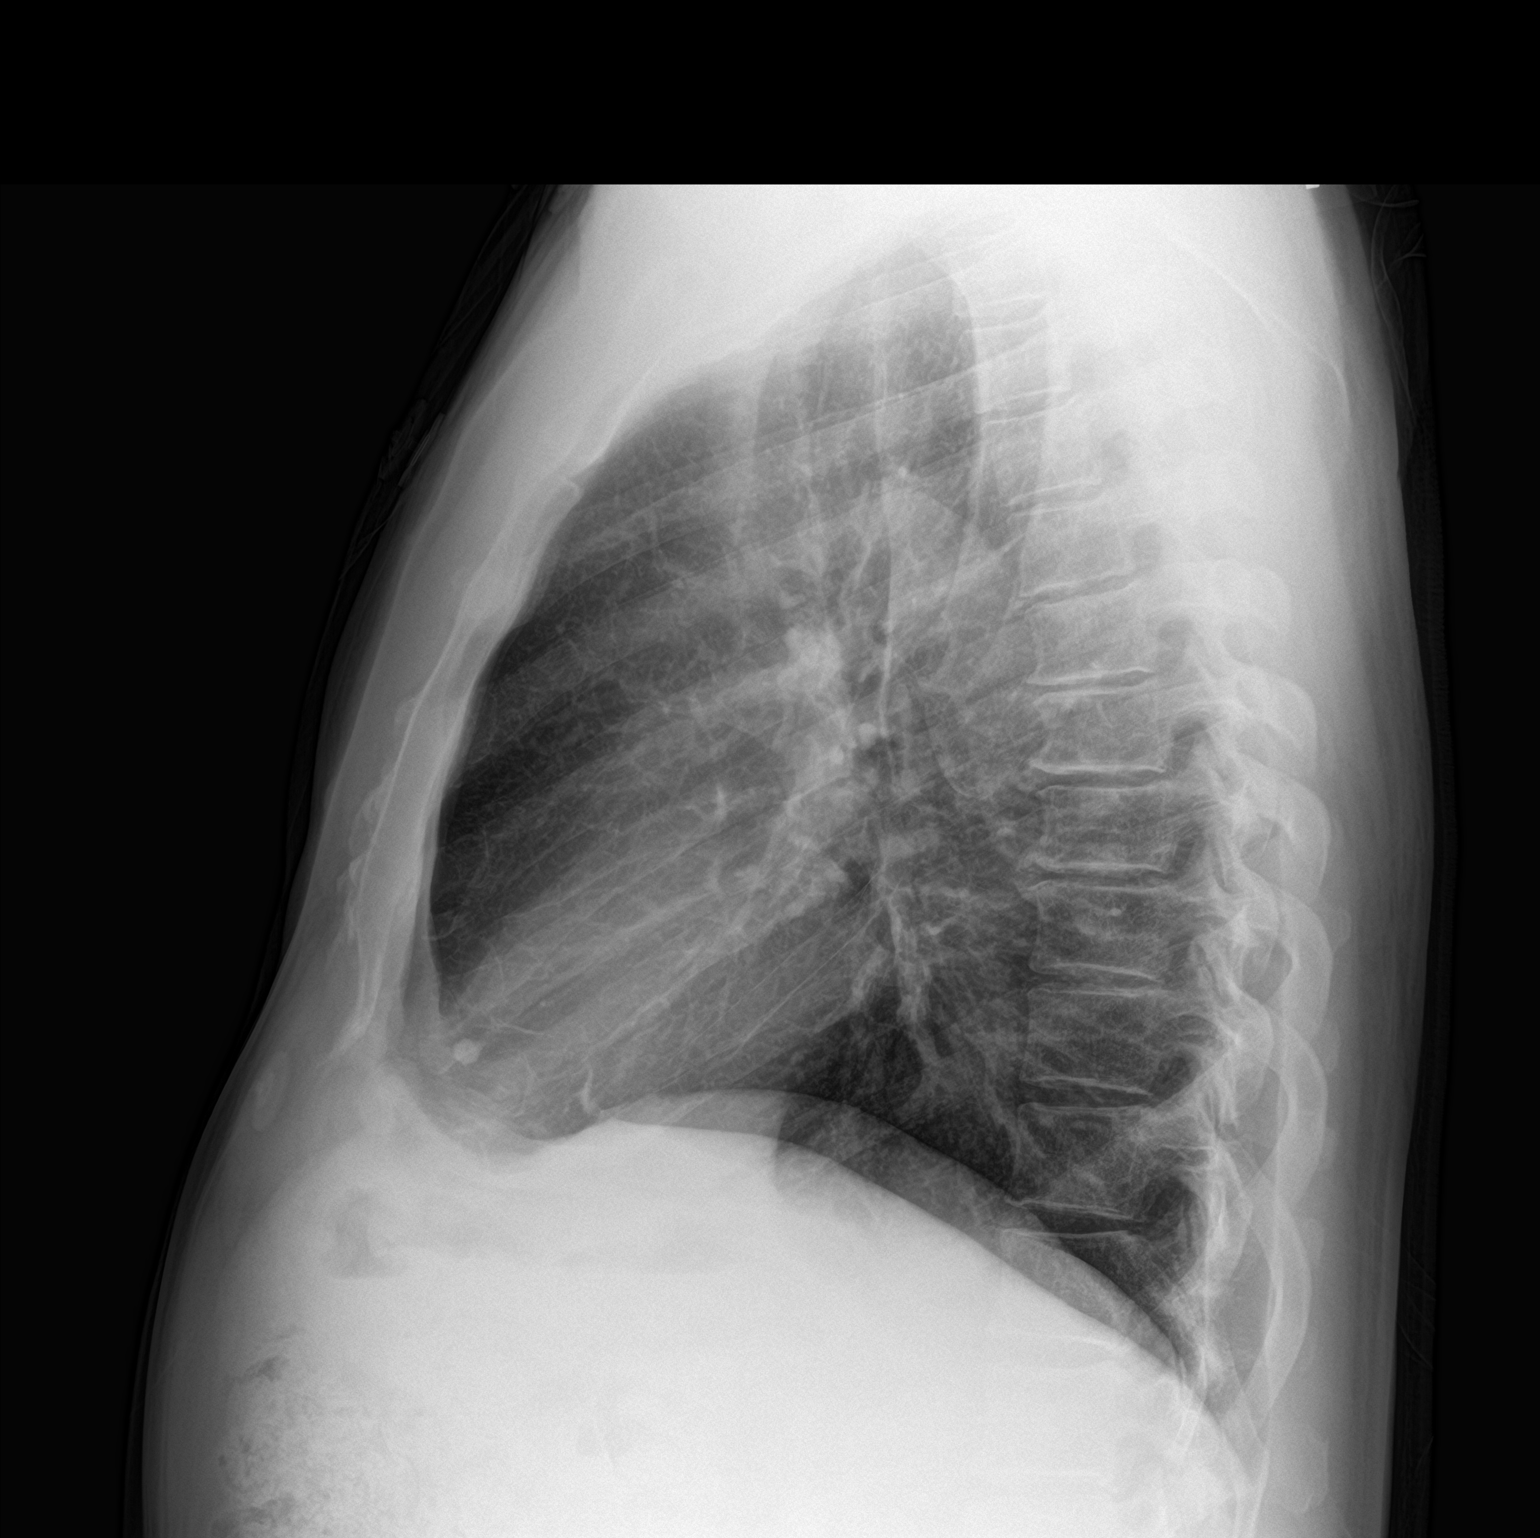

[2 of 2 positions shown; findings below may reference images not displayed]

FINDINGS: Trachea is midline. Heart size normal. Right middle lobe calcified
granuloma. Lungs are hyperinflated but otherwise clear. No pleural
fluid.
IMPRESSION: Hyperinflation.  No acute findings.

## 2020-06-29 DIAGNOSIS — G4733 Obstructive sleep apnea (adult) (pediatric): Secondary | ICD-10-CM | POA: Diagnosis not present

## 2020-06-30 DIAGNOSIS — G4733 Obstructive sleep apnea (adult) (pediatric): Secondary | ICD-10-CM | POA: Diagnosis not present

## 2020-07-28 DIAGNOSIS — R7303 Prediabetes: Secondary | ICD-10-CM | POA: Diagnosis not present

## 2020-07-28 DIAGNOSIS — Z7289 Other problems related to lifestyle: Secondary | ICD-10-CM | POA: Diagnosis not present

## 2020-07-28 DIAGNOSIS — M25512 Pain in left shoulder: Secondary | ICD-10-CM | POA: Diagnosis not present

## 2020-07-28 DIAGNOSIS — R0683 Snoring: Secondary | ICD-10-CM | POA: Diagnosis not present

## 2020-07-28 DIAGNOSIS — R400 Somnolence: Secondary | ICD-10-CM | POA: Diagnosis not present

## 2020-07-28 DIAGNOSIS — M19012 Primary osteoarthritis, left shoulder: Secondary | ICD-10-CM | POA: Diagnosis not present

## 2020-07-28 DIAGNOSIS — R69 Illness, unspecified: Secondary | ICD-10-CM | POA: Diagnosis not present

## 2020-07-28 DIAGNOSIS — R768 Other specified abnormal immunological findings in serum: Secondary | ICD-10-CM | POA: Diagnosis not present

## 2020-07-28 DIAGNOSIS — G4733 Obstructive sleep apnea (adult) (pediatric): Secondary | ICD-10-CM | POA: Diagnosis not present

## 2020-08-03 DIAGNOSIS — R400 Somnolence: Secondary | ICD-10-CM | POA: Diagnosis not present

## 2020-08-03 DIAGNOSIS — G4733 Obstructive sleep apnea (adult) (pediatric): Secondary | ICD-10-CM | POA: Diagnosis not present

## 2020-08-03 DIAGNOSIS — Z7289 Other problems related to lifestyle: Secondary | ICD-10-CM | POA: Diagnosis not present

## 2020-08-03 DIAGNOSIS — R7303 Prediabetes: Secondary | ICD-10-CM | POA: Diagnosis not present

## 2020-08-03 DIAGNOSIS — R768 Other specified abnormal immunological findings in serum: Secondary | ICD-10-CM | POA: Diagnosis not present

## 2020-08-03 DIAGNOSIS — M25512 Pain in left shoulder: Secondary | ICD-10-CM | POA: Diagnosis not present

## 2020-08-03 DIAGNOSIS — R69 Illness, unspecified: Secondary | ICD-10-CM | POA: Diagnosis not present

## 2020-08-03 DIAGNOSIS — M19012 Primary osteoarthritis, left shoulder: Secondary | ICD-10-CM | POA: Diagnosis not present

## 2021-03-27 DIAGNOSIS — Z20822 Contact with and (suspected) exposure to covid-19: Secondary | ICD-10-CM | POA: Diagnosis not present

## 2021-04-03 DIAGNOSIS — Z20822 Contact with and (suspected) exposure to covid-19: Secondary | ICD-10-CM | POA: Diagnosis not present

## 2021-08-07 DIAGNOSIS — Z0000 Encounter for general adult medical examination without abnormal findings: Secondary | ICD-10-CM | POA: Diagnosis not present

## 2021-08-07 DIAGNOSIS — Z1389 Encounter for screening for other disorder: Secondary | ICD-10-CM | POA: Diagnosis not present

## 2021-10-11 DIAGNOSIS — R7303 Prediabetes: Secondary | ICD-10-CM | POA: Diagnosis not present

## 2021-10-11 DIAGNOSIS — G4733 Obstructive sleep apnea (adult) (pediatric): Secondary | ICD-10-CM | POA: Diagnosis not present

## 2021-10-11 DIAGNOSIS — Z7289 Other problems related to lifestyle: Secondary | ICD-10-CM | POA: Diagnosis not present

## 2021-10-11 DIAGNOSIS — K047 Periapical abscess without sinus: Secondary | ICD-10-CM | POA: Diagnosis not present

## 2021-10-11 DIAGNOSIS — Z125 Encounter for screening for malignant neoplasm of prostate: Secondary | ICD-10-CM | POA: Diagnosis not present

## 2021-10-11 DIAGNOSIS — R69 Illness, unspecified: Secondary | ICD-10-CM | POA: Diagnosis not present

## 2021-10-11 DIAGNOSIS — R768 Other specified abnormal immunological findings in serum: Secondary | ICD-10-CM | POA: Diagnosis not present

## 2021-10-11 DIAGNOSIS — Z1211 Encounter for screening for malignant neoplasm of colon: Secondary | ICD-10-CM | POA: Diagnosis not present

## 2021-10-19 DIAGNOSIS — Z125 Encounter for screening for malignant neoplasm of prostate: Secondary | ICD-10-CM | POA: Diagnosis not present

## 2021-10-19 DIAGNOSIS — R7303 Prediabetes: Secondary | ICD-10-CM | POA: Diagnosis not present

## 2021-10-19 DIAGNOSIS — Z1211 Encounter for screening for malignant neoplasm of colon: Secondary | ICD-10-CM | POA: Diagnosis not present

## 2021-10-19 DIAGNOSIS — R768 Other specified abnormal immunological findings in serum: Secondary | ICD-10-CM | POA: Diagnosis not present

## 2022-01-09 DIAGNOSIS — R69 Illness, unspecified: Secondary | ICD-10-CM | POA: Diagnosis not present

## 2022-01-09 DIAGNOSIS — Z833 Family history of diabetes mellitus: Secondary | ICD-10-CM | POA: Diagnosis not present
